# Patient Record
Sex: Male | Born: 1995 | Race: Black or African American | Hispanic: No
Health system: Southern US, Community
[De-identification: ages and names within clinical notes are randomized; demographics above are authoritative.]

---
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
## 2020-06-17 ENCOUNTER — Emergency Department (HOSPITAL_COMMUNITY)
Admission: EM | Admit: 2020-06-17 | Discharge: 2020-06-17 | Disposition: A | Discharge status: Home / Self Care | Payer: HRSA Program | Attending: Emergency Medicine | Admitting: Emergency Medicine

## 2020-06-17 ENCOUNTER — Emergency Department (HOSPITAL_COMMUNITY): Payer: HRSA Program

## 2020-06-17 ENCOUNTER — Encounter (HOSPITAL_COMMUNITY): Payer: Self-pay | Admitting: Emergency Medicine

## 2020-06-17 DIAGNOSIS — U071 COVID-19: Secondary | ICD-10-CM | POA: Insufficient documentation

## 2020-06-17 DIAGNOSIS — R0789 Other chest pain: Secondary | ICD-10-CM

## 2020-06-17 DIAGNOSIS — M79604 Pain in right leg: Secondary | ICD-10-CM | POA: Insufficient documentation

## 2020-06-17 DIAGNOSIS — M79605 Pain in left leg: Secondary | ICD-10-CM | POA: Diagnosis not present

## 2020-06-17 DIAGNOSIS — R079 Chest pain, unspecified: Secondary | ICD-10-CM | POA: Diagnosis present

## 2020-06-17 LAB — CBC WITH DIFFERENTIAL/PLATELET
Abs Immature Granulocytes: 0.01 10*3/uL (ref 0.00–0.07)
Basophils Absolute: 0 10*3/uL (ref 0.0–0.1)
Basophils Relative: 1 %
Eosinophils Absolute: 0.1 10*3/uL (ref 0.0–0.5)
Eosinophils Relative: 2 %
HCT: 45.8 % (ref 39.0–52.0)
Hemoglobin: 14.9 g/dL (ref 13.0–17.0)
Immature Granulocytes: 0 %
Lymphocytes Relative: 8 %
Lymphs Abs: 0.3 10*3/uL — ABNORMAL LOW (ref 0.7–4.0)
MCH: 28.3 pg (ref 26.0–34.0)
MCHC: 32.5 g/dL (ref 30.0–36.0)
MCV: 87.1 fL (ref 80.0–100.0)
Monocytes Absolute: 0.8 10*3/uL (ref 0.1–1.0)
Monocytes Relative: 19 %
Neutro Abs: 2.9 10*3/uL (ref 1.7–7.7)
Neutrophils Relative %: 70 %
Platelets: 297 10*3/uL (ref 150–400)
RBC: 5.26 MIL/uL (ref 4.22–5.81)
RDW: 13.6 % (ref 11.5–15.5)
WBC: 4.1 10*3/uL (ref 4.0–10.5)
nRBC: 0 % (ref 0.0–0.2)

## 2020-06-17 LAB — COMPREHENSIVE METABOLIC PANEL
ALT: 42 U/L (ref 0–44)
AST: 35 U/L (ref 15–41)
Albumin: 4.6 g/dL (ref 3.5–5.0)
Alkaline Phosphatase: 51 U/L (ref 38–126)
Anion gap: 10 (ref 5–15)
BUN: 9 mg/dL (ref 6–20)
CO2: 21 mmol/L — ABNORMAL LOW (ref 22–32)
Calcium: 9.7 mg/dL (ref 8.9–10.3)
Chloride: 104 mmol/L (ref 98–111)
Creatinine, Ser: 0.99 mg/dL (ref 0.61–1.24)
GFR, Estimated: >60 mL/min (ref >60)
Glucose, Bld: 99 mg/dL (ref 70–99)
Potassium: 3.8 mmol/L (ref 3.5–5.1)
Sodium: 135 mmol/L (ref 135–145)
Total Bilirubin: 0.9 mg/dL (ref 0.3–1.2)
Total Protein: 7.4 g/dL (ref 6.5–8.1)

## 2020-06-17 LAB — D-DIMER, QUANTITATIVE (NOT AT ARMC): D-Dimer, Quant: <0.27 ug/mL-FEU (ref 0.00–0.50)

## 2020-06-17 LAB — RESPIRATORY PANEL BY RT PCR (FLU A&B, COVID)
Influenza A by PCR: NEGATIVE
Influenza B by PCR: NEGATIVE
SARS Coronavirus 2 by RT PCR: POSITIVE — AB

## 2020-06-17 LAB — TROPONIN I (HIGH SENSITIVITY)
Troponin I (High Sensitivity): 4 ng/L (ref <18)
Troponin I (High Sensitivity): 3 ng/L (ref <18)

## 2020-06-17 NOTE — ED Triage Notes (Signed)
Pt reports central to L sided chest pain that began last night, also endorses feeling as though he may have had a fever. C/o bilateral leg pain with walking, chest pain worse with movement. Denies cough, or sob.

## 2020-06-17 NOTE — ED Provider Notes (Addendum)
[facility] EMERGENCY DEPARTMENT Provider Note   CSN: [id number] Arrival date & time: 06/17/20  7846     History Chief Complaint  Patient presents with  . Chest Pain  . Leg Pain    Brandon Barker is a 24 y.o. male.  Patient felt well until about 10:00 last night.  When he started with substernal chest pain a little bit more to the left.  Been constant.  This morning he had body aches he is got aches in both eyes but right eye more than the left.  No sore throat no cough.  He may have had a fever.  No known Covid exposures but is concerned about possible Covid infection.  Past medical history noncontributory.        History reviewed. No pertinent past medical history.  There are no problems to display for this patient.   History reviewed. No pertinent surgical history.     No family history on file.  Social History   Tobacco Use  . Smoking status: Not on file  Substance Use Topics  . Alcohol use: Not on file  . Drug use: Not on file    Home Medications Prior to Admission medications   Not on File    Allergies    Patient has no known allergies.  Review of Systems   Review of Systems  Constitutional: Positive for fever. Negative for chills.  HENT: Negative for congestion, rhinorrhea and sore throat.   Eyes: Negative for visual disturbance.  Respiratory: Negative for cough and shortness of breath.   Cardiovascular: Positive for chest pain. Negative for leg swelling.  Gastrointestinal: Negative for abdominal pain, diarrhea, nausea and vomiting.  Genitourinary: Negative for dysuria.  Musculoskeletal: Positive for myalgias. Negative for back pain and neck pain.  Skin: Negative for rash.  Neurological: Negative for dizziness, light-headedness and headaches.  Hematological: Does not bruise/bleed easily.  Psychiatric/Behavioral: Negative for confusion.    Physical Exam Updated Vital Signs BP (!) 131/100   Pulse 99   Temp 99.9 F (37.7  C) (Oral)   Resp 16   Ht 1.778 m (5\' 10" )   Wt 83.9 kg   SpO2 100%   BMI 26.54 kg/m   Physical Exam Vitals and nursing note reviewed.  Constitutional:      Appearance: Normal appearance. He is well-developed.  HENT:     Head: Normocephalic and atraumatic.  Eyes:     Extraocular Movements: Extraocular movements intact.     Conjunctiva/sclera: Conjunctivae normal.     Pupils: Pupils are equal, round, and reactive to light.  Cardiovascular:     Rate and Rhythm: Normal rate and regular rhythm.     Heart sounds: No murmur heard.   Pulmonary:     Effort: Pulmonary effort is normal. No respiratory distress.     Breath sounds: Normal breath sounds.  Abdominal:     Palpations: Abdomen is soft.     Tenderness: There is no abdominal tenderness.  Musculoskeletal:        General: No swelling. Normal range of motion.     Cervical back: Normal range of motion and neck supple.  Skin:    General: Skin is warm and dry.     Capillary Refill: Capillary refill takes less than 2 seconds.  Neurological:     General: No focal deficit present.     Mental Status: He is alert and oriented to person, place, and time.     Cranial Nerves: No cranial nerve deficit.  Sensory: No sensory deficit.     Motor: No weakness.     ED Results / Procedures / Treatments   Labs (all labs ordered are listed, but only abnormal results are displayed) Labs Reviewed  RESPIRATORY PANEL BY RT PCR (FLU A&B, COVID) - Abnormal; Notable for the following components:      Result Value   SARS Coronavirus 2 by RT PCR POSITIVE (*)    All other components within normal limits  COMPREHENSIVE METABOLIC PANEL - Abnormal; Notable for the following components:   CO2 21 (*)    All other components within normal limits  CBC WITH DIFFERENTIAL/PLATELET - Abnormal; Notable for the following components:   Lymphs Abs 0.3 (*)    All other components within normal limits  D-DIMER, QUANTITATIVE (NOT AT [facility])  TROPONIN I (HIGH  SENSITIVITY)  TROPONIN I (HIGH SENSITIVITY)    EKG EKG Interpretation  Date/Time:  Friday June 17 2020 08:27:14 EDT Ventricular Rate:  101 PR Interval:  136 QRS Duration: 92 QT Interval:  312 QTC Calculation: 404 R Axis:   39 Text Interpretation: Sinus tachycardia Otherwise normal ECG No previous ECGs available Confirmed by Vanetta Mulders [phone]) on 06/17/2020 8:50:10 AM   Radiology DG Chest 2 View  Result Date: 06/17/2020 CLINICAL DATA:  Left chest pain EXAM: CHEST - 2 VIEW COMPARISON:  None. FINDINGS: The heart size and mediastinal contours are within normal limits. Both lungs are clear. The visualized skeletal structures are unremarkable. IMPRESSION: No active cardiopulmonary disease. Electronically Signed   By: Duanne Guess D.O.   On: 06/17/2020 08:49    Procedures Procedures (including critical care time)  Medications Ordered in ED Medications - No data to display  ED Course  I have reviewed the triage vital signs and the nursing notes.  Pertinent labs & imaging results that were available during my care of the patient were reviewed by me and considered in my medical decision making (see chart for details).    MDM Rules/Calculators/A&P                          EKG shows signs of sinus tachycardia no other acute changes.  Chest x-ray without any acute findings.  No leukocytosis.  Electrolytes without significant abnormalities.  Troponin still pending.  D-dimer due to the tachycardia and the sudden onset of chest pain.  Also Covid testing has been ordered.    Final Clinical Impression(s) / ED Diagnoses Final diagnoses:  COVID  Atypical chest pain   Initial troponin negative.  Based on the fact that the chest pain started at 10 PM last night delta troponin not required.  D-dimer normal.  Labs without significant abnormalities chest x-ray negative.  Covid testing pending.  If Covid test negative will treat as atypical chest pain with over-the-counter  Motrin.  Covid test positive. Prior explains most of the patient's symptoms. Cautions provided. Patient stable for discharge home note provided.  Rx / DC Orders ED Discharge Orders    None       Vanetta Mulders, MD 06/17/20 1147    Vanetta Mulders, MD 06/17/20 1250

## 2020-06-17 NOTE — Discharge Instructions (Addendum)
Covid test was positive. Work note provided to be out of work for the next 10 days. Would recommend taking over-the-counter multivitamin zinc supplement and vitamin D supplement. For the chest pain take either Motrin or Tylenol. Return for any new or worse symptoms return for any shortness of breath or difficulty breathing.

## 2020-06-18 ENCOUNTER — Telehealth: Payer: Self-pay | Admitting: Family

## 2020-06-18 NOTE — Telephone Encounter (Signed)
Called to Discuss with patient about Covid symptoms and the use of the monoclonal antibody infusion for those with mild to moderate Covid symptoms and at a high risk of hospitalization.     Pt appears to qualify for this infusion due to co-morbid conditions and/or a member of an at-risk group in accordance with the FDA Emergency Use Authorization.    Mr. Walmsley was seen on 06/17/2020 at the [facility], ED with chest pain and body aches.  Symptom onset was 10/14.  Qualifying risk factors include BMI greater than 25 and high social vulnerability risk score.  Attempted to speak with Mr. Whorley however his phone number was disconnected and was unable to leave a message. No MyChart has been set up. Will attempt to send text.  Marcos Eke, NP 06/18/2020 10:46 AM

## 2020-06-19 ENCOUNTER — Telehealth: Payer: Self-pay | Admitting: Physician Assistant

## 2020-06-19 NOTE — Telephone Encounter (Signed)
Called to Discuss with patient about Covid symptoms and the use of the monoclonal antibody infusion for those with mild to moderate Covid symptoms and at a high risk of hospitalization.     Pt appears to qualify for this infusion due to co-morbid conditions and/or a member of an at-risk group in accordance with the FDA Emergency Use Authorization.    Unable to reach pt    

## 2020-08-05 ENCOUNTER — Telehealth: Payer: Self-pay | Admitting: General Practice

## 2020-08-05 NOTE — Telephone Encounter (Signed)
Called patient to let them know their financial counseling appointment has been cancelled. Call could not be completed. If patient calls back please advise them they cannot schedule for financial counseling until they complete a new patient appointment with one of our providers at [facility], [facility], or [facility].

## 2020-08-09 ENCOUNTER — Ambulatory Visit: Payer: Self-pay

## 2021-04-06 IMAGING — CR DG CHEST 2V
2 series · 2 of 2 positions shown · non-contrast
Comparison: None.

CLINICAL DATA: Left chest pain

EXAM:
CHEST - 2 VIEW

[chest pa]
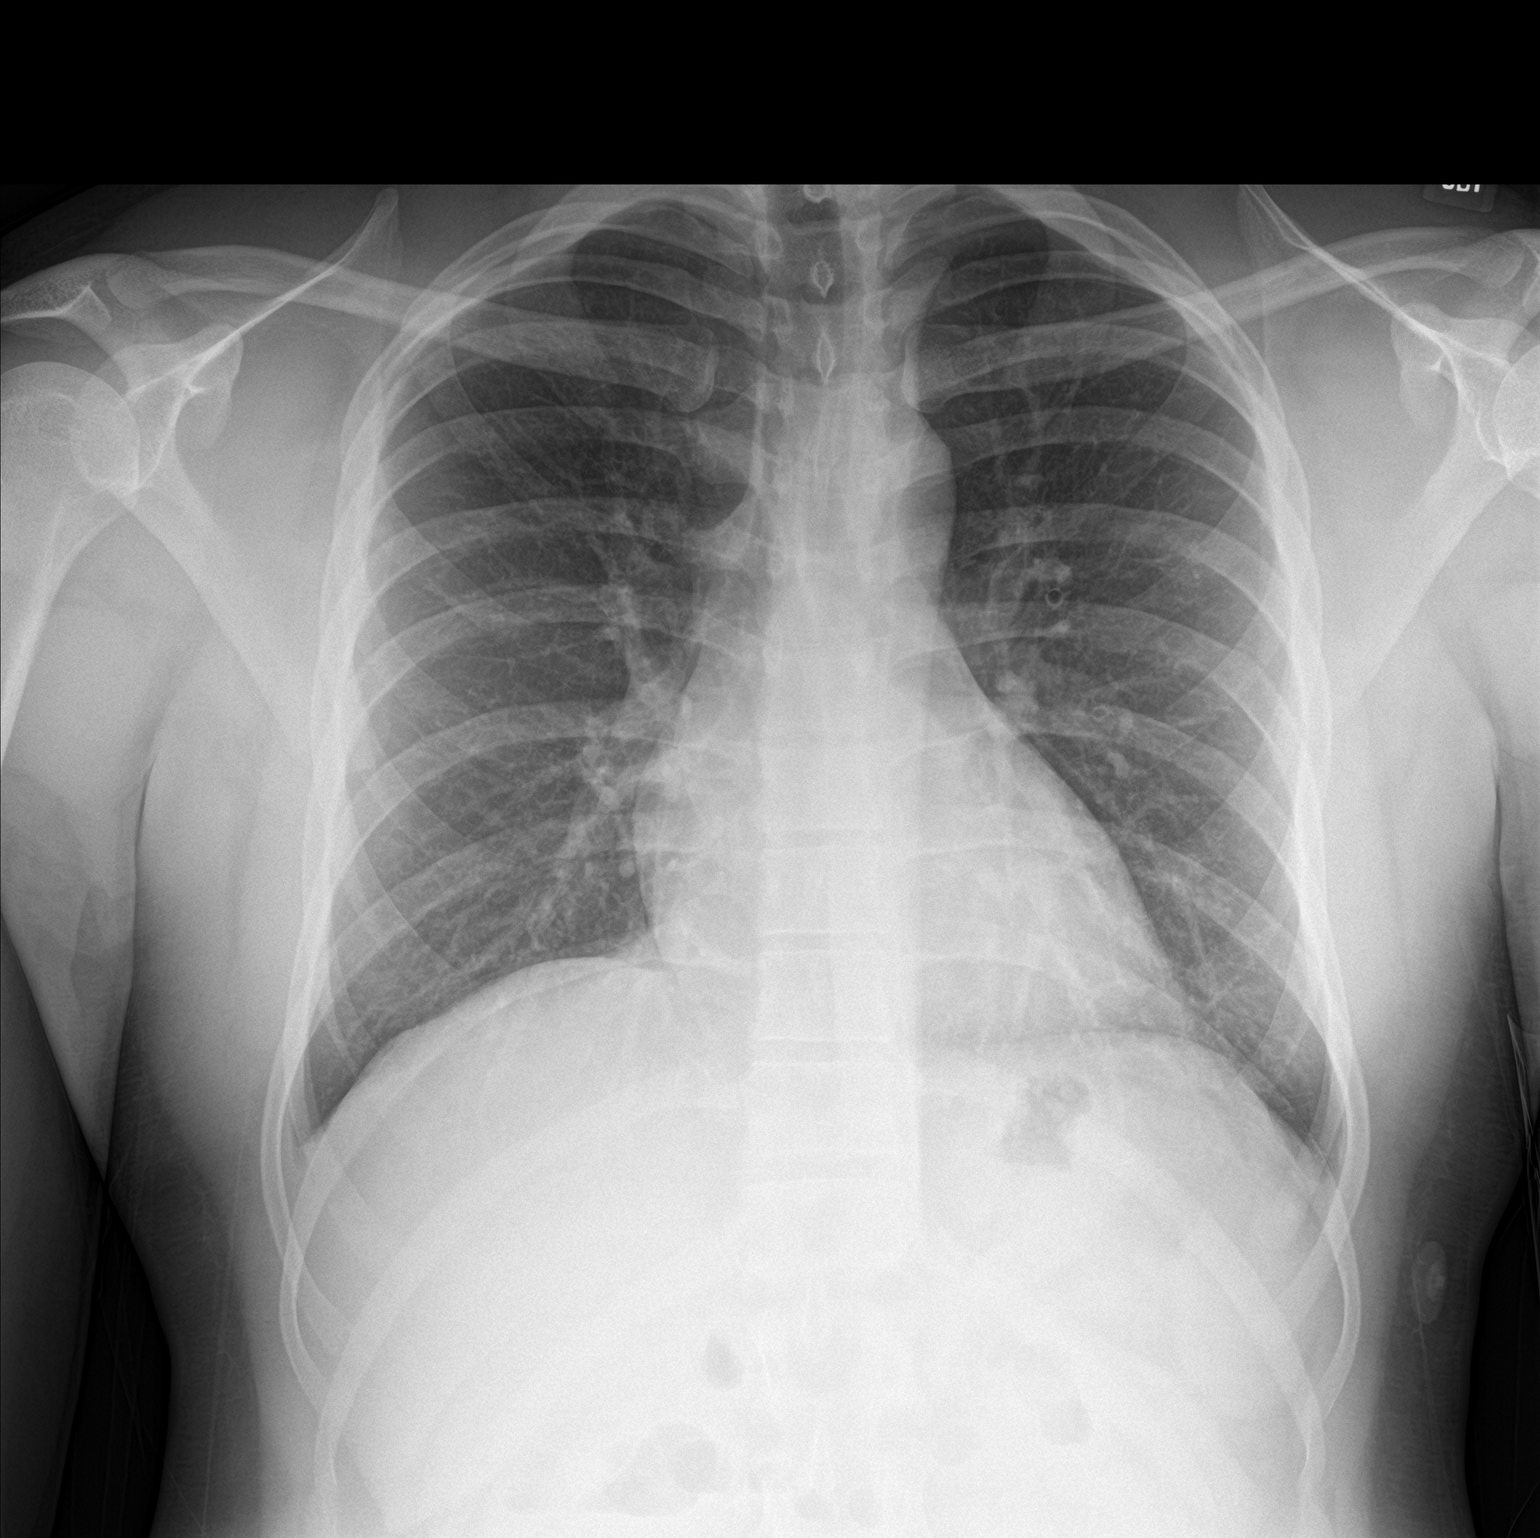

[chest lat]
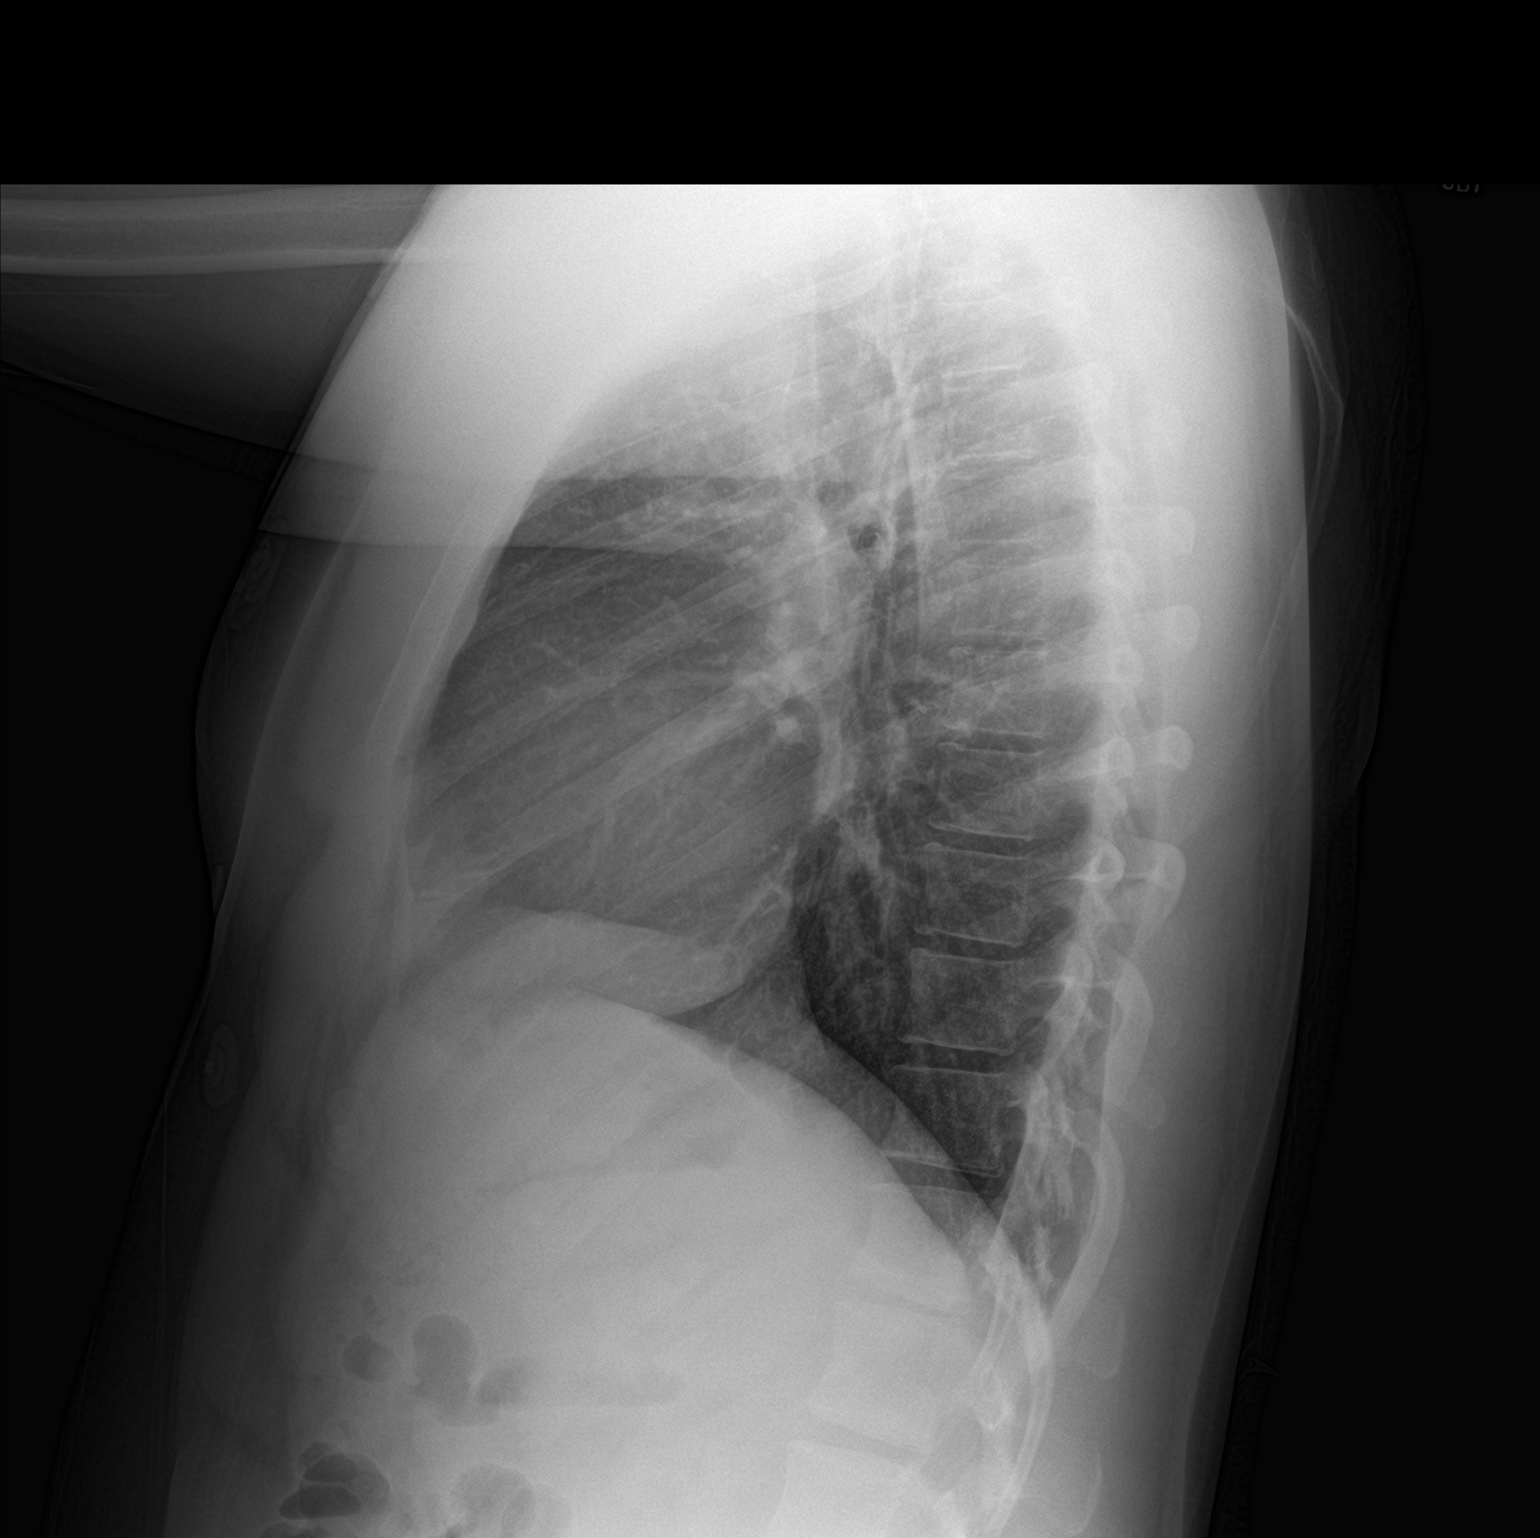

[2 of 2 positions shown; findings below may reference images not displayed]

FINDINGS: The heart size and mediastinal contours are within normal limits.
Both lungs are clear. The visualized skeletal structures are
unremarkable.
IMPRESSION: No active cardiopulmonary disease.
